# Patient Record
Sex: Male | Born: 1995 | Race: White | Hispanic: No | Marital: Single
Health system: Midwestern US, Academic
[De-identification: ages and names within clinical notes are randomized; demographics above are authoritative.]

---

## 2013-10-25 ENCOUNTER — Inpatient Hospital Stay: Admit: 2013-10-25 | Payer: PRIVATE HEALTH INSURANCE

## 2013-10-25 ENCOUNTER — Ambulatory Visit: Admit: 2013-10-25 | Payer: PRIVATE HEALTH INSURANCE

## 2013-10-25 DIAGNOSIS — J039 Acute tonsillitis, unspecified: Secondary | ICD-10-CM

## 2013-10-25 LAB — STUDENTHS HEMOGRAM AND 3 PART DIFF
Hematocrit: 49 % (ref 38.5–50.0)
Hemoglobin: 15.1 g/dL (ref 13.2–17.1)
Lymphocytes Absolute: 2681.4 /uL
Lymphocytes Relative: 24.6 % (ref 15.0–45.0)
MCH: 28.7 pg (ref 27.0–33.0)
MCHC: 30.9 g/dL (ref 32.0–36.0)
MCV: 92.8 fL (ref 80.0–100.0)
MPV: 9.6 fL (ref 7.5–11.5)
Monocytes Absolute: 468.7 /uL
Monocytes Relative: 4.3 % (ref 0.0–12.0)
Neutrophils Absolute: 7749.9 /uL
Neutrophils Relative: 71.1 % (ref 40.0–80.0)
Platelets: 152 10*3/uL (ref 140–400)
RBC: 5.28 10*6/uL (ref 4.20–5.80)
RDW: 12.3 % (ref 11.0–15.0)
WBC: 10.9 10*3/uL (ref 3.8–10.8)

## 2013-10-25 LAB — THROAT CULTURE: Organism 2: NORMAL

## 2013-10-25 LAB — STUDENTHS RAPID STREP A CULT IF NEG: Rapid Strep A Screen: NEGATIVE

## 2013-10-25 LAB — MONONUCLEOSIS SCREEN: Infectious Mono: NEGATIVE

## 2013-10-25 MED ORDER — amoxicillin-clavulanate (AUGMENTIN) 875-125 mg per tablet
875-125 | ORAL_TABLET | Freq: Two times a day (BID) | ORAL | Status: AC
Start: 2013-10-25 — End: 2013-11-04

## 2013-10-25 NOTE — Unmapped (Signed)
Subjective  HPI:   Patient ID: Jonathan Delacruz is a 18 y.o. male.    Chief Complaint:  Sore Throat   This is a new problem. Episode onset: 2-3 d. Maximum temperature: feverish. Associated symptoms include congestion and headaches. Pertinent negatives include no coughing, ear pain or vomiting. He has had exposure to mono. He has had no exposure to strep. He has tried nothing for the symptoms.              Medications:       ROS:   Review of Systems   Constitutional: Positive for fever.   HENT: Positive for congestion. Negative for ear pain.    Respiratory: Negative for cough.    Gastrointestinal: Negative for vomiting.   Neurological: Positive for headaches.       Filed Vitals:    10/25/13 1537   BP: 148/71   Pulse: 64   Temp: 97.8 ??F (36.6 ??C)   TempSrc: Oral   Height: 6' 1 (1.854 m)   Weight: 210 lb (95.255 kg)     Body mass index is 27.71 kg/(m^2).  Body surface area is 2.22 meters squared.     Objective:     Physical Exam   Vitals reviewed.  Constitutional: He appears well-nourished. No distress.   HENT:   Right Ear: Tympanic membrane normal.   Left Ear: Tympanic membrane normal.   Mouth/Throat: Oropharyngeal exudate (slight), posterior oropharyngeal edema and posterior oropharyngeal erythema present.   Eyes: Conjunctivae are normal.   Neck: Neck supple.   Cardiovascular: Normal rate and regular rhythm.    Pulmonary/Chest: Effort normal and breath sounds normal.   Lymphadenopathy:     He has cervical adenopathy.              RS neg, MS neg         Assessment/Plan:     1. Tonsillitis  Mononucleosis screen    [facility] - Hemogram w/3 Part Diff    [facility] Rapid Strep A Cult If Neg    amoxicillin-clavulanate (AUGMENTIN) 875-125 mg per tablet     Pt ed, fluids, rest, OTC's disc for URI symptomatic relief, ret 3-5 days prn.

## 2018-10-21 ENCOUNTER — Other Ambulatory Visit: Payer: Self-pay

## 2018-10-21 ENCOUNTER — Emergency Department
Admission: EM | Admit: 2018-10-21 | Discharge: 2018-10-21 | Disposition: A | Discharge status: Home / Self Care | Payer: 59 | Attending: Emergency Medicine | Admitting: Emergency Medicine

## 2018-10-21 ENCOUNTER — Encounter: Payer: Self-pay | Admitting: Emergency Medicine

## 2018-10-21 ENCOUNTER — Emergency Department: Payer: 59

## 2018-10-21 DIAGNOSIS — Y9355 Activity, bike riding: Secondary | ICD-10-CM | POA: Diagnosis not present

## 2018-10-21 DIAGNOSIS — Y92410 Unspecified street and highway as the place of occurrence of the external cause: Secondary | ICD-10-CM | POA: Insufficient documentation

## 2018-10-21 DIAGNOSIS — Y999 Unspecified external cause status: Secondary | ICD-10-CM | POA: Diagnosis not present

## 2018-10-21 DIAGNOSIS — T07XXXA Unspecified multiple injuries, initial encounter: Secondary | ICD-10-CM | POA: Diagnosis not present

## 2018-10-21 DIAGNOSIS — S4991XA Unspecified injury of right shoulder and upper arm, initial encounter: Secondary | ICD-10-CM | POA: Diagnosis not present

## 2018-10-21 NOTE — Discharge Instructions (Addendum)
Follow-up with the orthopedist and in approximately 1 week as discussed.  Keep the arm in a sling at all times except when you are showering until then.  Return to the ER for new or worsening pain, swelling, weakness or numbness, or any other new or worsening symptoms that concern you.

## 2018-10-21 NOTE — ED Notes (Signed)
Pt placed in right arm sling by NT.
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
## 2019-10-22 ENCOUNTER — Encounter: Payer: Self-pay | Admitting: Family Medicine

## 2019-10-22 ENCOUNTER — Encounter: Payer: Self-pay | Admitting: General Practice

## 2019-10-22 ENCOUNTER — Ambulatory Visit (INDEPENDENT_AMBULATORY_CARE_PROVIDER_SITE_OTHER): Payer: 59 | Admitting: Family Medicine

## 2019-10-22 ENCOUNTER — Other Ambulatory Visit: Payer: Self-pay

## 2019-10-22 VITALS — BP 108/64 | HR 60 | Temp 96.1°F | Ht 73.0 in | Wt 205.2 lb

## 2019-10-22 DIAGNOSIS — S0181XA Laceration without foreign body of other part of head, initial encounter: Secondary | ICD-10-CM | POA: Diagnosis not present

## 2019-10-22 DIAGNOSIS — S0081XA Abrasion of other part of head, initial encounter: Secondary | ICD-10-CM

## 2019-10-22 DIAGNOSIS — G44319 Acute post-traumatic headache, not intractable: Secondary | ICD-10-CM | POA: Diagnosis not present

## 2019-10-22 NOTE — Patient Instructions (Signed)
Laceration Care, Adult  A laceration is a cut that may go through all layers of the skin and into the tissue that is right under the skin. Some lacerations heal on their own. Others need to be closed with stitches (sutures), staples, skin adhesive strips, or skin glue. Proper care of a laceration reduces the risk for infection, helps the laceration heal better, and may prevent scarring.  How to care for your laceration  Wash your hands with soap and water before touching your wound or changing your bandage (dressing). If soap and water are not available, use hand sanitizer.  Keep the wound clean and dry.  If you were given a dressing, you should change it at least once a day, or as told by your health care provider. You should also change it if it becomes wet or dirty.  If sutures or staples were used:  · Keep the wound completely dry for the first 24 hours, or as told by your health care provider. After that time, you may shower or bathe. However, make sure that the wound is not soaked in water until after the sutures or staples have been removed.  · Clean the wound once each day, or as told by your health care provider:  ? Wash the wound with soap and water.  ? Rinse the wound with water to remove all soap.  ? Pat the wound dry with a clean towel. Do not rub the wound.  · After cleaning the wound, apply a thin layer of antibiotic ointment as told by your health care provider. This will help prevent infection and keep the dressing from sticking to the wound.  · Have the sutures or staples removed as told by your health care provider.  If skin adhesive strips were used:  · Do not get the skin adhesive strips wet. You may shower or bathe, but be careful to keep the wound dry.  · If the wound gets wet, pat it dry with a clean towel. Do not rub the wound.  · Skin adhesive strips fall off on their own. You may trim the strips as the wound heals. Do not remove skin adhesive strips that are still stuck to the wound. They  will fall off in time.  If skin glue was used:  · Try to keep the wound dry, but you may briefly wet it in the shower or bath. Do not soak the wound in water, such as by swimming.  · After you have showered or bathed, gently pat the wound dry with a clean towel. Do not rub the wound.  · Do not do any activities that will make you sweat heavily until the skin glue has fallen off on its own.  · Do not apply liquid, cream, or ointment medicine to the wound while the skin glue is in place. Using those may loosen the film before the wound has healed.  · If a dressing is placed over the wound, be careful not to apply tape directly over the skin glue. Doing that may cause the glue to be pulled off before the wound has healed.  · Do not pick at the glue. Skin glue usually remains in place for 5-10 days and then falls off the skin.  General instructions    · Take over-the-counter and prescription medicines only as told by your health care provider.  · If you were prescribed an antibiotic medicine or ointment, take or apply it as told by your health care provider.   infection. Watch for: ? Redness, swelling, or pain. ? Fluid, blood, or pus.  Raise (elevate) the injured area above the level of your heart while you are sitting or lying down for the first 24-48 hours after the laceration is repaired.  If directed, put ice on the affected area: ? Put ice in a plastic bag. ? Place a towel between your skin and the bag. ? Leave the ice on for 20 minutes, 2-3 times a day.  Keep all follow-up visits as told by your health care provider. This is important. Contact a health care provider if:  You received a tetanus shot and you have swelling, severe pain, redness, or bleeding at the injection site.  You have a fever.  A wound that was closed breaks open.  You notice a bad smell  coming from your wound or your dressing.  You notice something coming out of the wound, such as wood or glass.  Your pain is not controlled with medicine.  You have increased redness, swelling, or pain at the site of your wound.  You have fluid, blood, or pus coming from your wound.  You need to change the dressing often due to fluid, blood, or pus that is draining from the wound.  You develop a new rash.  You develop numbness around the wound. Get help right away if:  You develop severe swelling around the wound.  Your pain suddenly increases and is severe.  You develop painful lumps near the wound or on skin anywhere else on your body.  You have a red streak going away from your wound.  The wound is on your hand or foot and you cannot properly move a finger or toe.  The wound is on your hand or foot, and you notice that your fingers or toes look pale or bluish. Summary  A laceration is a cut that may go through all layers of the skin and into the tissue that is right under the skin.  Some lacerations heal on their own. Others need to be closed with stitches (sutures), staples, skin adhesive strips, or skin glue.  Proper care of a laceration reduces the risk of infection, helps the laceration heal better, and prevents scarring. This information is not intended to replace advice given to you by your health care provider. Make sure you discuss any questions you have with your health care provider. Document Revised: 11/04/2017 Document Reviewed: 09/26/2017 Elsevier Patient Education  [place], Adult  A concussion is a brain injury from a hard, direct hit (trauma) to the head or body. This direct hit causes the brain to shake quickly back and forth inside the skull. This can damage brain cells and cause chemical changes in the brain. A concussion may also be known as a mild traumatic brain injury (TBI). Concussions are usually not life-threatening, but  the effects of a concussion can be serious. If you have a concussion, you should be very careful to avoid having a second concussion. What are the causes? This condition is caused by:  A direct hit to your head, such as: ? Running into another player during a game. ? Being hit in a fight. ? Hitting your head on a hard surface.  Sudden movement of your body that causes your brain to move back and forth inside the skull, such as in a car crash. What are the signs or symptoms? The signs of a concussion can be hard to notice. Early on, they may be  missed by you, family members, and health care providers. You may look fine on the outside but may act or feel differently. Symptoms are usually temporary and most often improve in 7-10 days. Some symptoms appear right away, but other symptoms may not show up for hours or days. If your symptoms last longer than normal, you may have post-concussion syndrome. Every head injury is different. Physical symptoms  Headaches. This can include a feeling of pressure in the head or migraine-like symptoms.  Tiredness (fatigue).  Dizziness.  Problems with coordination or balance.  Vision or hearing problems.  Sensitivity to light or noise.  Nausea or vomiting.  Changes in eating or sleeping patterns.  Numbness or tingling.  Seizure. Mental and emotional symptoms  Memory problems.  Trouble concentrating, organizing, or making decisions.  Slowness in thinking, acting or reacting, speaking, or reading.  Irritability or mood changes.  Anxiety or depression. How is this diagnosed? This condition is diagnosed based on:  Your symptoms.  A description of your injury. You may also have tests, including:  Imaging tests, such as a CT scan or MRI.  Neuropsychological tests. These measure your thinking, understanding, learning, and remembering abilities. How is this treated? Treatment for this condition includes:  Stopping sports or activity if  you are injured. If you hit your head or show signs of concussion: ? Do not return to sports or activities the same day. ? Get checked by a health care provider before you return to your activities.  Physical and mental rest and careful observation, usually at home. Gradually return to your normal activities.  Medicines to help with symptoms such as headaches, nausea, or difficulty sleeping. ? Avoid taking opioid pain medicine while recovering from a concussion.  Avoiding alcohol and drugs. These may slow your recovery and can put you at risk of further injury.  Referral to a concussion clinic or rehabilitation center. Recovery from a concussion can take time. How fast you recover depends on many factors. Return to activities only when:  Your symptoms are completely gone.  Your health care provider says that it is safe. Follow these instructions at home: Activity  Limit activities that require a lot of thought or concentration, such as: ? Doing homework or job-related work. ? Watching TV. ? Working on the computer or phone. ? Playing memory games and puzzles.  Rest. Rest helps your brain heal. Make sure you: ? Get plenty of sleep. Most adults should get 7-9 hours of sleep each night. ? Rest during the day. Take naps or rest breaks when you feel tired.  Avoid physical activity like exercise until your health care provider says it is safe. Stop any activity that worsens symptoms.  Do not do high-risk activities that could cause a second concussion, such as riding a bike or playing sports.  Ask your health care provider when you can return to your normal activities, such as school, work, athletics, and driving. Your ability to react may be slower after a brain injury. Never do these activities if you are dizzy. Your health care provider will likely give you a plan for gradually returning to activities. General instructions   Take over-the-counter and prescription medicines only as  told by your health care provider. Some medicines, such as blood thinners (anticoagulants) and aspirin, may increase the risk for complications, such as bleeding.  Do not drink alcohol until your health care provider says you can.  Watch your symptoms and tell others around you to do the same. Complications  sometimes occur after a concussion. Older adults with a brain injury may have a higher risk of serious complications.  Tell your work Production designer, theatre/television/film, teachers, Tax adviser, school counselor, coach, or Event organiser about your injury, symptoms, and restrictions.  Keep all follow-up visits as told by your health care provider. This is important. How is this prevented? Avoiding another brain injury is very important. In rare cases, another injury can lead to permanent brain damage, brain swelling, or death. The risk of this is greatest during the first 7-10 days after a head injury. Avoid injuries by:  Stopping activities that could lead to a second concussion, such as contact or recreational sports, until your health care provider says it is okay.  Taking these actions once you have returned to sports or activities: ? Avoiding plays or moves that can cause you to crash into another person. This is how most concussions occur. ? Following the rules and being respectful of other players. Do not engage in violent or illegal plays.  Getting regular exercise that includes strength and balance training.  Wearing a properly fitting helmet during sports, biking, or other activities. Helmets can help protect you from serious skull and brain injuries, but they do not protect you from a concussion. Even when wearing a helmet, you should avoid being hit in the head. Contact a health care provider if:  Your symptoms get worse or they do not improve.  You have new symptoms.  You have another injury. Get help right away if:  You have severe or worsening headaches.  You have weakness or numbness in any  part of your body.  You are confused.  Your coordination gets worse.  You vomit repeatedly.  You are sleepier than normal.  Your speech is slurred.  You cannot recognize people or places.  You have a seizure.  It is difficult to wake you up.  You have unusual behavior changes.  You have changes in your vision.  You lose consciousness. Summary  A concussion is a brain injury that results from a hard, direct hit (trauma) to your head or body.  You may have imaging tests and neuropsychological tests to diagnose a concussion.  Treatment for this condition includes physical and mental rest and careful observation.  Ask your health care provider when you can return to your normal activities, such as school, work, athletics, and driving.  Get help right away if you have a severe headache, weakness on one side of the body, seizures, behavior changes, changes in vision, or if you are confused or sleepier than normal. This information is not intended to replace advice given to you by your health care provider. Make sure you discuss any questions you have with your health care provider. Document Revised: 04/27/2018 Document Reviewed: 04/27/2018 Elsevier Patient Education  2020 [employer].
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
## 2019-10-29 ENCOUNTER — Encounter: Payer: Self-pay | Admitting: Family Medicine

## 2019-10-29 ENCOUNTER — Ambulatory Visit (INDEPENDENT_AMBULATORY_CARE_PROVIDER_SITE_OTHER): Payer: 59 | Admitting: Family Medicine

## 2019-10-29 ENCOUNTER — Other Ambulatory Visit: Payer: Self-pay

## 2019-10-29 VITALS — BP 120/64 | HR 60 | Temp 97.6°F | Ht 73.0 in | Wt 209.8 lb

## 2019-10-29 DIAGNOSIS — S0081XA Abrasion of other part of head, initial encounter: Secondary | ICD-10-CM | POA: Diagnosis not present

## 2019-10-29 DIAGNOSIS — S0181XA Laceration without foreign body of other part of head, initial encounter: Secondary | ICD-10-CM | POA: Diagnosis not present

## 2019-10-29 DIAGNOSIS — L03211 Cellulitis of face: Secondary | ICD-10-CM

## 2019-10-29 DIAGNOSIS — G44319 Acute post-traumatic headache, not intractable: Secondary | ICD-10-CM

## 2019-10-29 MED ORDER — CEPHALEXIN 500 MG PO CAPS: 500.0000 mg | ORAL_CAPSULE | Freq: Three times a day (TID) | ORAL | 0 refills | Status: AC

## 2019-10-29 NOTE — Progress Notes (Signed)
Established Patient Office Visit  Subjective:  Patient ID: Jeremy Jordan, male    DOB: 1996-03-20  Age: 24 y.o. MRN: [id number]  CC:  Chief Complaint  Patient presents with  . Follow-up    f/u on injury, pt states that he is not sure if he is able to fly supposed to be going to [place] this week, per pt healing have improved, pt not sure if he still needs CT because he have not heard from them to schedule.     HPI Jeremy Jordan presents for follow-up of his injuries.  Headaches have mostly resolved.  Thinking more clearly now.  Wounds are healing.  Denies diplopia.  However he does have pain in his forehead if he darts his eyes quickly.  Patient tells me that he was never contacted about his CT scan that had been ordered last week.  Has a trip scheduled to see his family this coming weekend down in [facility] [place].  No past medical history on file.  No past surgical history on file.  Family History  Problem Relation Age of Onset  . Hypertension Mother   . Stroke Father   . Heart disease Father   . Healthy Sister   . Endocrine tumor Brother   . Healthy Maternal Grandfather   . Healthy Paternal Grandfather     Social History   Socioeconomic History  . Marital status: Single    Spouse name: Not on file  . Number of children: Not on file  . Years of education: Not on file  . Highest education level: Not on file  Occupational History  . Not on file  Tobacco Use  . Smoking status: Never Smoker  . Smokeless tobacco: Never Used  Substance and Sexual Activity  . Alcohol use: Yes    Alcohol/week: 2.0 standard drinks    Types: 1 Shots of liquor, 1 Cans of beer per week    Comment: socially  . Drug use: Never  . Sexual activity: Yes  Other Topics Concern  . Not on file  Social History Narrative  . Not on file   Social Determinants of Health   Financial Resource Strain:   . Difficulty of Paying Living Expenses: Not on file  Food Insecurity:   . Worried About Charity fundraiser  in the Last Year: Not on file  . Ran Out of Food in the Last Year: Not on file  Transportation Needs:   . Lack of Transportation (Medical): Not on file  . Lack of Transportation (Non-Medical): Not on file  Physical Activity:   . Days of Exercise per Week: Not on file  . Minutes of Exercise per Session: Not on file  Stress:   . Feeling of Stress : Not on file  Social Connections:   . Frequency of Communication with Friends and Family: Not on file  . Frequency of Social Gatherings with Friends and Family: Not on file  . Attends Religious Services: Not on file  . Active Member of Clubs or Organizations: Not on file  . Attends Archivist Meetings: Not on file  . Marital Status: Not on file  Intimate Partner Violence:   . Fear of Current or Ex-Partner: Not on file  . Emotionally Abused: Not on file  . Physically Abused: Not on file  . Sexually Abused: Not on file    No outpatient medications prior to visit.   No facility-administered medications prior to visit.    No Known Allergies  ROS Review  of Systems  Constitutional: Negative.   Respiratory: Negative.   Cardiovascular: Negative.   Gastrointestinal: Negative.   Neurological: Negative for speech difficulty, weakness, light-headedness and headaches.  Psychiatric/Behavioral: Negative.       Objective:    Physical Exam  Constitutional: He is oriented to person, place, and time. He appears well-developed and well-nourished. No distress.  HENT:  Head: Normocephalic and atraumatic.  Right Ear: External ear normal.  Left Ear: External ear normal.  Eyes: Pupils are equal, round, and reactive to light. Conjunctivae and EOM are normal. Right eye exhibits no discharge. Left eye exhibits no discharge. No scleral icterus.    Neck: No JVD present. No tracheal deviation present.  Pulmonary/Chest: Effort normal. No stridor.  Neurological: He is alert and oriented to person, place, and time. No cranial nerve deficit.    Skin: He is not diaphoretic.     Psychiatric: He has a normal mood and affect.    BP 120/64   Pulse 60   Temp 97.6 F (36.4 C) (Tympanic)   Ht 6' 1"  (1.854 m)   Wt 209 lb 12.8 oz (95.2 kg)   SpO2 99%   BMI 27.68 kg/m  Wt Readings from Last 3 Encounters:  10/29/19 209 lb 12.8 oz (95.2 kg)  10/22/19 205 lb 3.2 oz (93.1 kg)  10/21/18 200 lb (90.7 kg)     Health Maintenance Due  Topic Date Due  . HIV Screening  08/05/2009  . TETANUS/TDAP  08/05/2013    There are no preventive care reminders to display for this patient.  No results found for: TSH No results found for: WBC, HGB, HCT, MCV, PLT No results found for: NA, K, CHLORIDE, CO2, GLUCOSE, BUN, CREATININE, BILITOT, ALKPHOS, AST, ALT, PROT, ALBUMIN, CALCIUM, ANIONGAP, EGFR, GFR No results found for: CHOL No results found for: HDL No results found for: LDLCALC No results found for: TRIG No results found for: CHOLHDL No results found for: HGBA1C    Assessment & Plan:   Problem List Items Addressed This Visit      Musculoskeletal and Integument   Abrasion of face     Other   Acute post-traumatic headache, not intractable - Primary   Facial laceration   Cellulitis of face   Relevant Medications   cephALEXin (KEFLEX) 500 MG capsule      Meds ordered this encounter  Medications  . cephALEXin (KEFLEX) 500 MG capsule    Sig: Take 1 capsule (500 mg total) by mouth 3 (three) times daily for 10 days.    Dispense:  30 capsule    Refill:  0    Follow-up: Return in about 2 weeks (around 11/12/2019).  Unfortunately is best for him to hold his trip for another time.  He agrees.  Follow-up in 2 weeks to reschedule his wounds.  Libby Maw, MD

## 2019-10-31 ENCOUNTER — Other Ambulatory Visit: Payer: 59

## 2019-11-05 ENCOUNTER — Telehealth: Payer: Self-pay

## 2019-11-06 ENCOUNTER — Telehealth: Payer: Self-pay | Admitting: Behavioral Health

## 2019-11-06 NOTE — Telephone Encounter (Signed)
Patient is returning the call. CB is [phone]

## 2019-11-06 NOTE — Telephone Encounter (Signed)
Patient voiced that he's not having headaches or any other symptoms at this time. He reported that since his last visit, he's been exercising and working from home, so he's feeling less stressed.   Per Dr. Doreene Burke- If symptoms have improved, please disregard CT referral. Follow-up with PCP if symptoms re-occur.  Informed patient of the provider's recommendations. He verbalized understanding and did not have any further questions or concerns.  Patient has a 2-week follow-up with Dr. Doreene Burke on 11/12/19 at 11 AM.

## 2019-11-06 NOTE — Telephone Encounter (Signed)
Attempted to reach patient per Dr. Evangeline Gula request regarding the patient's symptoms. No answer at the time of call. Unable to leave message; voice mailbox is full. Will try calling the patient again at a later time.

## 2019-11-07 NOTE — Telephone Encounter (Signed)
Agreed -

## 2019-11-12 ENCOUNTER — Encounter: Payer: Self-pay | Admitting: Family Medicine

## 2019-11-12 ENCOUNTER — Other Ambulatory Visit: Payer: Self-pay

## 2019-11-12 ENCOUNTER — Ambulatory Visit (INDEPENDENT_AMBULATORY_CARE_PROVIDER_SITE_OTHER): Payer: 59 | Admitting: Family Medicine

## 2019-11-12 VITALS — BP 126/70 | HR 42 | Temp 97.2°F | Ht 73.0 in | Wt 210.0 lb

## 2019-11-12 DIAGNOSIS — S0181XA Laceration without foreign body of other part of head, initial encounter: Secondary | ICD-10-CM

## 2019-11-12 DIAGNOSIS — S0081XA Abrasion of other part of head, initial encounter: Secondary | ICD-10-CM

## 2019-11-12 DIAGNOSIS — G44319 Acute post-traumatic headache, not intractable: Secondary | ICD-10-CM

## 2019-11-12 NOTE — Progress Notes (Signed)
Established Patient Office Visit  Subjective:  Patient ID: Jeremy Jordan, male    DOB: 01/18/1996  Age: 24 y.o. MRN: [id number]  CC:  Chief Complaint  Patient presents with  . Follow-up    follow up on injuries, patient would like to know what exercises can he do and if he could drink alcohol?    HPI Jeremy Jordan presents for follow-up of the abrasions on his face and his posttraumatic headache.  Headache is completely resolved.  There is no longer any pain when he moves his eyes.  Denies changes in his vision or nausea and vomiting.  Abrasions are healing nicely there is lingering erythema around the wounds but there is no discharge or streaking.  Developed fatigue and malaise with his first dose of cephalexin but did not have that side effect with subsequent doses.  Currently training to run a half marathon and has been running up to 20 miles weekly.  Wants to know when he can return to his training.  No past medical history on file.  No past surgical history on file.  Family History  Problem Relation Age of Onset  . Hypertension Mother   . Stroke Father   . Heart disease Father   . Healthy Sister   . Endocrine tumor Brother   . Healthy Maternal Grandfather   . Healthy Paternal Grandfather     Social History   Socioeconomic History  . Marital status: Single    Spouse name: Not on file  . Number of children: Not on file  . Years of education: Not on file  . Highest education level: Not on file  Occupational History  . Not on file  Tobacco Use  . Smoking status: Never Smoker  . Smokeless tobacco: Never Used  Substance and Sexual Activity  . Alcohol use: Yes    Alcohol/week: 2.0 standard drinks    Types: 1 Shots of liquor, 1 Cans of beer per week    Comment: socially  . Drug use: Never  . Sexual activity: Yes  Other Topics Concern  . Not on file  Social History Narrative  . Not on file   Social Determinants of Health   Financial Resource Strain:   . Difficulty of  Paying Living Expenses: Not on file  Food Insecurity:   . Worried About Charity fundraiser in the Last Year: Not on file  . Ran Out of Food in the Last Year: Not on file  Transportation Needs:   . Lack of Transportation (Medical): Not on file  . Lack of Transportation (Non-Medical): Not on file  Physical Activity:   . Days of Exercise per Week: Not on file  . Minutes of Exercise per Session: Not on file  Stress:   . Feeling of Stress : Not on file  Social Connections:   . Frequency of Communication with Friends and Family: Not on file  . Frequency of Social Gatherings with Friends and Family: Not on file  . Attends Religious Services: Not on file  . Active Member of Clubs or Organizations: Not on file  . Attends Archivist Meetings: Not on file  . Marital Status: Not on file  Intimate Partner Violence:   . Fear of Current or Ex-Partner: Not on file  . Emotionally Abused: Not on file  . Physically Abused: Not on file  . Sexually Abused: Not on file    No outpatient medications prior to visit.   No facility-administered medications prior to visit.  No Known Allergies  ROS Review of Systems  Constitutional: Negative.   Eyes: Negative for photophobia and visual disturbance.  Respiratory: Negative.   Cardiovascular: Negative.   Gastrointestinal: Negative.   Endocrine: Negative for polyphagia and polyuria.  Genitourinary: Negative.   Skin: Positive for color change and wound.  Neurological: Negative for headaches.  Psychiatric/Behavioral: Negative.       Objective:    Physical Exam  Constitutional: He is oriented to person, place, and time. He appears well-developed and well-nourished. No distress.  HENT:  Head: Normocephalic and atraumatic.  Right Ear: External ear normal.  Left Ear: External ear normal.  Eyes: Conjunctivae are normal. Right eye exhibits no discharge. Left eye exhibits no discharge. No scleral icterus.  Neck: No JVD present. No  tracheal deviation present.  Pulmonary/Chest: Effort normal. No stridor.  Neurological: He is alert and oriented to person, place, and time.  Skin: Skin is warm and dry. He is not diaphoretic.     Psychiatric: He has a normal mood and affect. His behavior is normal.    BP 126/70   Pulse (!) 42   Temp (!) 97.2 F (36.2 C) (Tympanic)   Ht 6' 1"  (1.854 m)   Wt 210 lb (95.3 kg)   SpO2 100%   BMI 27.71 kg/m  Wt Readings from Last 3 Encounters:  11/12/19 210 lb (95.3 kg)  10/29/19 209 lb 12.8 oz (95.2 kg)  10/22/19 205 lb 3.2 oz (93.1 kg)     Health Maintenance Due  Topic Date Due  . HIV Screening  08/05/2009  . TETANUS/TDAP  08/05/2013    There are no preventive care reminders to display for this patient.  No results found for: TSH No results found for: WBC, HGB, HCT, MCV, PLT No results found for: NA, K, CHLORIDE, CO2, GLUCOSE, BUN, CREATININE, BILITOT, ALKPHOS, AST, ALT, PROT, ALBUMIN, CALCIUM, ANIONGAP, EGFR, GFR No results found for: CHOL No results found for: HDL No results found for: LDLCALC No results found for: TRIG No results found for: CHOLHDL No results found for: HGBA1C    Assessment & Plan:   Problem List Items Addressed This Visit      Musculoskeletal and Integument   Abrasion of face - Primary     Other   Acute post-traumatic headache, not intractable   Facial laceration      No orders of the defined types were placed in this encounter.   Follow-up: Return if symptoms worsen or fail to improve.   Headache is resolved and abrasions are healing nicely.  Discussed using a vitamin E with cocoa butter cream and avoiding excessive sunlight over the next 6 months.  He was given information on wound care.  Advised him to return gradually to his 20 mile per week running schedule.  We discussed appropriate alcohol usage to no more than 2 drinks in a given setting. Libby Maw, MD

## 2019-11-12 NOTE — Patient Instructions (Signed)
Wound Care, Adult Taking care of your wound properly can help to prevent pain, infection, and scarring. It can also help your wound to heal more quickly. How to care for your wound Wound care      Follow instructions from your health care provider about how to take care of your wound. Make sure you: ? Wash your hands with soap and water before you change the bandage (dressing). If soap and water are not available, use hand sanitizer. ? Change your dressing as told by your health care provider. ? Leave stitches (sutures), skin glue, or adhesive strips in place. These skin closures may need to stay in place for 2 weeks or longer. If adhesive strip edges start to loosen and curl up, you may trim the loose edges. Do not remove adhesive strips completely unless your health care provider tells you to do that.  Check your wound area every day for signs of infection. Check for: ? Redness, swelling, or pain. ? Fluid or blood. ? Warmth. ? Pus or a bad smell.  Ask your health care provider if you should clean the wound with mild soap and water. Doing this may include: ? Using a clean towel to pat the wound dry after cleaning it. Do not rub or scrub the wound. ? Applying a cream or ointment. Do this only as told by your health care provider. ? Covering the incision with a clean dressing.  Ask your health care provider when you can leave the wound uncovered.  Keep the dressing dry until your health care provider says it can be removed. Do not take baths, swim, use a hot tub, or do anything that would put the wound underwater until your health care provider approves. Ask your health care provider if you can take showers. You may only be allowed to take sponge baths. Medicines   If you were prescribed an antibiotic medicine, cream, or ointment, take or use the antibiotic as told by your health care provider. Do not stop taking or using the antibiotic even if your condition improves.  Take  over-the-counter and prescription medicines only as told by your health care provider. If you were prescribed pain medicine, take it 30 or more minutes before you do any wound care or as told by your health care provider. General instructions  Return to your normal activities as told by your health care provider. Ask your health care provider what activities are safe.  Do not scratch or pick at the wound.  Do not use any products that contain nicotine or tobacco, such as cigarettes and e-cigarettes. These may delay wound healing. If you need help quitting, ask your health care provider.  Keep all follow-up visits as told by your health care provider. This is important.  Eat a diet that includes protein, vitamin A, vitamin C, and other nutrient-rich foods to help the wound heal. ? Foods rich in protein include meat, dairy, beans, nuts, and other sources. ? Foods rich in vitamin A include carrots and dark green, leafy vegetables. ? Foods rich in vitamin C include citrus, tomatoes, and other fruits and vegetables. ? Nutrient-rich foods have protein, carbohydrates, fat, vitamins, or minerals. Eat a variety of healthy foods including vegetables, fruits, and whole grains. Contact a health care provider if:  You received a tetanus shot and you have swelling, severe pain, redness, or bleeding at the injection site.  Your pain is not controlled with medicine.  You have redness, swelling, or pain around the wound.    You have fluid or blood coming from the wound.  Your wound feels warm to the touch.  You have pus or a bad smell coming from the wound.  You have a fever or chills.  You are nauseous or you vomit.  You are dizzy. Get help right away if:  You have a red streak going away from your wound.  The edges of the wound open up and separate.  Your wound is bleeding, and the bleeding does not stop with gentle pressure.  You have a rash.  You faint.  You have trouble  breathing. Summary  Always wash your hands with soap and water before changing your bandage (dressing).  To help with healing, eat foods that are rich in protein, vitamin A, vitamin C, and other nutrients.  Check your wound every day for signs of infection. Contact your health care provider if you suspect that your wound is infected. This information is not intended to replace advice given to you by your health care provider. Make sure you discuss any questions you have with your health care provider. Document Revised: 12/25/2018 Document Reviewed: 03/23/2016 Elsevier Patient Education  2020 [employer]  

## 2020-03-04 ENCOUNTER — Encounter: Payer: Self-pay | Admitting: Nurse Practitioner

## 2020-03-04 ENCOUNTER — Telehealth: Payer: Self-pay

## 2020-03-04 ENCOUNTER — Other Ambulatory Visit: Payer: Self-pay

## 2020-03-04 ENCOUNTER — Telehealth (INDEPENDENT_AMBULATORY_CARE_PROVIDER_SITE_OTHER): Payer: 59 | Admitting: Nurse Practitioner

## 2020-03-04 VITALS — HR 64 | Ht 73.0 in | Wt 201.0 lb

## 2020-03-04 DIAGNOSIS — J069 Acute upper respiratory infection, unspecified: Secondary | ICD-10-CM

## 2020-03-04 MED ORDER — FLUTICASONE PROPIONATE 50 MCG/ACT NA SUSP: 2.0000 | Freq: Every day | NASAL | 0 refills | Status: DC

## 2020-03-04 MED ORDER — CHLORPHEN-PE-ACETAMINOPHEN 4-10-325 MG PO TABS: 1.0000 | ORAL_TABLET | Freq: Three times a day (TID) | ORAL | 0 refills | Status: AC

## 2020-03-04 MED ORDER — MUCINEX 600 MG PO TB12: 600.0000 mg | ORAL_TABLET | Freq: Two times a day (BID) | ORAL | 0 refills | Status: AC | PRN

## 2020-03-04 NOTE — Patient Instructions (Signed)
Schedule appt for COVID test as soon as possible.  Start norel AD (this has tylenol in it) and flonase as prescribed.  Encourage adequate oral hydration.  Use" Delsym" or" Robitussin" cough syrup varietis for cough.  You can use "Advil/ibuprofen/motrin" for fever, chills and achyness.   "Common cold" symptoms are usually triggered by a virus.  The antibiotics are usually not necessary. On average, a" viral cold" illness would take 5-10 days to resolve.   Call office if no improvement by Thursday and your COVID test is negative.
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
## 2020-03-04 NOTE — Telephone Encounter (Signed)
Pt called in asking about the medications sent in today, Pt was worried about strep throat and if he had it should he go get tested. Pt also went and got a rapid covid test and awaiting results of that.  Claris Gower verbally told me to tell the pt to take the medication prescribed for atleast 48 hours and if not relief go to an UC to get swabbed and tested for strep.

## 2020-03-26 ENCOUNTER — Other Ambulatory Visit: Payer: Self-pay | Admitting: Nurse Practitioner

## 2020-03-26 DIAGNOSIS — J069 Acute upper respiratory infection, unspecified: Secondary | ICD-10-CM
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
